# Patient Record
Sex: Male | Born: 1980 | Hispanic: No | Marital: Married | Smoking: Never smoker
Health system: Southern US, Community
[De-identification: ages and names within clinical notes are randomized; demographics above are authoritative.]

## PROBLEM LIST (undated history)

## (undated) DIAGNOSIS — N200 Calculus of kidney: Secondary | ICD-10-CM

---

## 2008-05-18 ENCOUNTER — Emergency Department (HOSPITAL_COMMUNITY): Admission: EM | Admit: 2008-05-18 | Discharge: 2008-05-18 | Payer: Self-pay | Admitting: Emergency Medicine

## 2017-10-24 ENCOUNTER — Other Ambulatory Visit: Payer: Self-pay

## 2017-10-24 ENCOUNTER — Emergency Department (HOSPITAL_BASED_OUTPATIENT_CLINIC_OR_DEPARTMENT_OTHER)
Admission: EM | Admit: 2017-10-24 | Discharge: 2017-10-24 | Disposition: A | Discharge status: Home / Self Care | Payer: BLUE CROSS/BLUE SHIELD | Attending: Emergency Medicine | Admitting: Emergency Medicine

## 2017-10-24 ENCOUNTER — Encounter (HOSPITAL_BASED_OUTPATIENT_CLINIC_OR_DEPARTMENT_OTHER): Payer: Self-pay | Admitting: Emergency Medicine

## 2017-10-24 ENCOUNTER — Emergency Department (HOSPITAL_BASED_OUTPATIENT_CLINIC_OR_DEPARTMENT_OTHER): Payer: BLUE CROSS/BLUE SHIELD

## 2017-10-24 DIAGNOSIS — N200 Calculus of kidney: Secondary | ICD-10-CM | POA: Insufficient documentation

## 2017-10-24 DIAGNOSIS — R112 Nausea with vomiting, unspecified: Secondary | ICD-10-CM | POA: Insufficient documentation

## 2017-10-24 DIAGNOSIS — R109 Unspecified abdominal pain: Secondary | ICD-10-CM | POA: Diagnosis present

## 2017-10-24 HISTORY — DX: Calculus of kidney: N20.0

## 2017-10-24 LAB — URINALYSIS, ROUTINE W REFLEX MICROSCOPIC
BILIRUBIN URINE: NEGATIVE
Glucose, UA: NEGATIVE mg/dL
KETONES UR: NEGATIVE mg/dL
Leukocytes, UA: NEGATIVE
NITRITE: NEGATIVE
PH: 6.5 (ref 5.0–8.0)
Protein, ur: NEGATIVE mg/dL
Specific Gravity, Urine: 1.02 (ref 1.005–1.030)

## 2017-10-24 LAB — URINALYSIS, MICROSCOPIC (REFLEX)

## 2017-10-24 LAB — BASIC METABOLIC PANEL
Anion gap: 7 (ref 5–15)
BUN: 11 mg/dL (ref 6–20)
CO2: 27 mmol/L (ref 22–32)
CREATININE: 0.93 mg/dL (ref 0.61–1.24)
Calcium: 9.2 mg/dL (ref 8.9–10.3)
Chloride: 100 mmol/L — ABNORMAL LOW (ref 101–111)
GFR calc Af Amer: >60 mL/min (ref >60)
GLUCOSE: 145 mg/dL — AB (ref 65–99)
POTASSIUM: 3.7 mmol/L (ref 3.5–5.1)
Sodium: 134 mmol/L — ABNORMAL LOW (ref 135–145)

## 2017-10-24 LAB — CBC
HCT: 43.2 % (ref 39.0–52.0)
Hemoglobin: 14.9 g/dL (ref 13.0–17.0)
MCH: 30 pg (ref 26.0–34.0)
MCHC: 34.5 g/dL (ref 30.0–36.0)
MCV: 87.1 fL (ref 78.0–100.0)
Platelets: 442 10*3/uL — ABNORMAL HIGH (ref 150–400)
RBC: 4.96 MIL/uL (ref 4.22–5.81)
RDW: 13.4 % (ref 11.5–15.5)
WBC: 15.1 10*3/uL — ABNORMAL HIGH (ref 4.0–10.5)

## 2017-10-24 MED ORDER — OXYCODONE-ACETAMINOPHEN 5-325 MG PO TABS: 1.0000 | ORAL_TABLET | ORAL | 0 refills | Status: AC | PRN

## 2017-10-24 MED ORDER — HYDROMORPHONE HCL 1 MG/ML IJ SOLN
1.0000 mg | Freq: Once | INTRAMUSCULAR | Status: AC
  Administered 2017-10-24: 1 mg via INTRAVENOUS
  Filled 2017-10-24: qty 1

## 2017-10-24 MED ORDER — TAMSULOSIN HCL 0.4 MG PO CAPS: 0.4000 mg | ORAL_CAPSULE | Freq: Every day | ORAL | 0 refills | Status: AC

## 2017-10-24 MED ORDER — ONDANSETRON HCL 4 MG/2ML IJ SOLN
4.0000 mg | Freq: Once | INTRAMUSCULAR | Status: AC
  Administered 2017-10-24: 4 mg via INTRAVENOUS
  Filled 2017-10-24: qty 2

## 2017-10-24 MED ORDER — MORPHINE SULFATE (PF) 4 MG/ML IV SOLN
4.0000 mg | Freq: Once | INTRAVENOUS | Status: AC
  Administered 2017-10-24: 4 mg via INTRAVENOUS
  Filled 2017-10-24: qty 1

## 2017-10-24 MED ORDER — PROMETHAZINE HCL 25 MG PO TABS: 25.0000 mg | ORAL_TABLET | Freq: Four times a day (QID) | ORAL | 0 refills | Status: AC | PRN

## 2017-10-24 MED ORDER — KETOROLAC TROMETHAMINE 10 MG PO TABS: 10.0000 mg | ORAL_TABLET | Freq: Four times a day (QID) | ORAL | 0 refills | Status: AC | PRN

## 2017-10-24 MED ORDER — SODIUM CHLORIDE 0.9 % IV BOLUS (SEPSIS)
1000.0000 mL | Freq: Once | INTRAVENOUS | Status: AC
  Administered 2017-10-24: 1000 mL via INTRAVENOUS

## 2017-10-24 MED ORDER — SODIUM CHLORIDE 0.9 % IV SOLN
INTRAVENOUS | Status: DC
  Administered 2017-10-24: 12:00:00 via INTRAVENOUS

## 2017-10-24 MED FILL — TAMSULOSIN HCL 0.4 MG CAPS: 0.4 | 14 days supply | Qty: 14 | Fill #0

## 2017-10-24 MED FILL — PROMETHAZINE 25 MG TABLET: 25 | 7 days supply | Qty: 30 | Fill #0

## 2017-10-24 MED FILL — KETOROLAC 10 MG TABLET: 10 | 2 days supply | Qty: 10 | Fill #0

## 2017-10-24 MED FILL — OXYCODONE-ACETAMINOPHEN 5-3: 5-325 | 2 days supply | Qty: 20 | Fill #0

## 2017-10-24 NOTE — ED Triage Notes (Signed)
Right flank pain since 10pm last night.  Sts he has had a kidney stone in the past and this is what it felt like.

## 2017-10-24 NOTE — ED Provider Notes (Signed)
[facility] EMERGENCY DEPARTMENT Provider Note   CSN: [id number] Arrival date & time: 10/24/17  1055     History   Chief Complaint Chief Complaint  Patient presents with  . Flank Pain    HPI Steven Rojas is a 37 y.o. male.  Pt presents to the ED today with right sided flank pain and n/v.  The pt said it started at 2200 last night and has continued.  The pt has had a kidney stone in the past and pain today feels the same.      Past Medical History:  Diagnosis Date  . Kidney stone     There are no active problems to display for this patient.   History reviewed. No pertinent surgical history.     Home Medications    Prior to Admission medications   Medication Sig Start Date End Date Taking? Authorizing Provider  ketorolac (TORADOL) 10 MG tablet Take 1 tablet (10 mg total) by mouth every 6 (six) hours as needed. 10/24/17   Jacalyn Lefevre, MD  oxyCODONE-acetaminophen (PERCOCET/ROXICET) 5-325 MG tablet Take 1-2 tablets by mouth every 4 (four) hours as needed for severe pain. 10/24/17   Jacalyn Lefevre, MD  promethazine (PHENERGAN) 25 MG tablet Take 1 tablet (25 mg total) by mouth every 6 (six) hours as needed for nausea or vomiting. 10/24/17   Jacalyn Lefevre, MD  tamsulosin (FLOMAX) 0.4 MG CAPS capsule Take 1 capsule (0.4 mg total) by mouth daily. 10/24/17   Jacalyn Lefevre, MD    Family History No family history on file.  Social History Social History   Tobacco Use  . Smoking status: Never Smoker  . Smokeless tobacco: Never Used  Substance Use Topics  . Alcohol use: No    Frequency: Never  . Drug use: No     Allergies   Patient has no known allergies.   Review of Systems Review of Systems  Gastrointestinal: Positive for nausea and vomiting.  Genitourinary: Positive for flank pain.  All other systems reviewed and are negative.    Physical Exam Updated Vital Signs BP (!) 148/93 (BP Location: Right Arm)   Pulse (!) 59   Temp 97.8 F  (36.6 C) (Oral)   Resp 16   Ht 6\' 1"  (1.854 m)   Wt 84.8 kg (187 lb)   SpO2 98%   BMI 24.67 kg/m   Physical Exam  Constitutional: He is oriented to person, place, and time. He appears well-developed and well-nourished.  HENT:  Head: Normocephalic and atraumatic.  Right Ear: External ear normal.  Left Ear: External ear normal.  Nose: Nose normal.  Mouth/Throat: Oropharynx is clear and moist.  Eyes: Conjunctivae and EOM are normal. Pupils are equal, round, and reactive to light.  Neck: Normal range of motion. Neck supple.  Cardiovascular: Normal rate, regular rhythm, normal heart sounds and intact distal pulses.  Pulmonary/Chest: Effort normal and breath sounds normal.  Abdominal: Soft. Bowel sounds are normal.  Musculoskeletal: Normal range of motion.  Neurological: He is alert and oriented to person, place, and time.  Skin: Skin is warm and dry. Capillary refill takes less than 2 seconds.  Psychiatric: He has a normal mood and affect. His behavior is normal. Judgment and thought content normal.  Nursing note and vitals reviewed.    ED Treatments / Results  Labs (all labs ordered are listed, but only abnormal results are displayed) Labs Reviewed  BASIC METABOLIC PANEL - Abnormal; Notable for the following components:      Result Value  Sodium 134 (*)    Chloride 100 (*)    Glucose, Bld 145 (*)    All other components within normal limits  CBC - Abnormal; Notable for the following components:   WBC 15.1 (*)    Platelets 442 (*)    All other components within normal limits  URINALYSIS, ROUTINE W REFLEX MICROSCOPIC - Abnormal; Notable for the following components:   Hgb urine dipstick MODERATE (*)    All other components within normal limits  URINALYSIS, MICROSCOPIC (REFLEX) - Abnormal; Notable for the following components:   Bacteria, UA FEW (*)    Squamous Epithelial / LPF 0-5 (*)    All other components within normal limits    EKG  EKG Interpretation None         Radiology Ct Renal Stone Study  Result Date: 10/24/2017 CLINICAL DATA:  RIGHT flank pain EXAM: CT ABDOMEN AND PELVIS WITHOUT CONTRAST TECHNIQUE: Multidetector CT imaging of the abdomen and pelvis was performed following the standard protocol without IV contrast. COMPARISON:  CT 05/18/2008 FINDINGS: Lower chest: Lung bases are clear. Hepatobiliary: No focal hepatic lesion. No biliary duct dilatation. Gallbladder is normal. Common bile duct is normal. Pancreas: Pancreas is normal. No ductal dilatation. No pancreatic inflammation. Spleen: Normal spleen Adrenals/urinary tract: Adrenal glands normal. There is mild hydronephrosis and hydroureter of the RIGHT collecting system secondary to obstructing calculus in the mid RIGHT ureter. This calculus measures 6 mm is at the L4 vertebral body level. Calculus is subtly evident on CT topogram. No additional renal calculi.  No bladder calculi. Stomach/Bowel: Stomach, small bowel, appendix, and cecum are normal. The colon and rectosigmoid colon are normal. Vascular/Lymphatic: Abdominal aorta is normal caliber. There is no retroperitoneal or periportal lymphadenopathy. No pelvic lymphadenopathy. Reproductive: Prostate normal Other: No free fluid. Musculoskeletal: No aggressive osseous lesion. IMPRESSION: 1. Obstructing calculus in mid RIGHT ureter. 2. No nephrolithiasis. Electronically Signed   By: Genevive Bi M.D.   On: 10/24/2017 11:34    Procedures Procedures (including critical care time)  Medications Ordered in ED Medications  sodium chloride 0.9 % bolus 1,000 mL (0 mLs Intravenous Stopped 10/24/17 1201)    And  0.9 %  sodium chloride infusion ( Intravenous Stopped 10/24/17 1250)  morphine 4 MG/ML injection 4 mg (4 mg Intravenous Given 10/24/17 1127)  ondansetron (ZOFRAN) injection 4 mg (4 mg Intravenous Given 10/24/17 1125)  sodium chloride 0.9 % bolus 1,000 mL (0 mLs Intravenous Stopped 10/24/17 1315)  HYDROmorphone (DILAUDID) injection 1 mg (1 mg  Intravenous Given 10/24/17 1244)  HYDROmorphone (DILAUDID) injection 1 mg (1 mg Intravenous Given 10/24/17 1337)     Initial Impression / Assessment and Plan / ED Course  I have reviewed the triage vital signs and the nursing notes.  Pertinent labs & imaging results that were available during my care of the patient were reviewed by me and considered in my medical decision making (see chart for details).    Pain is much improved.  He is offered admission as he does have an obstructed stone.  He said he will try outpatient treatment first.  He knows that he is welcome to come back at any time if pain becomes intolerable.  Pt told that [facility] ED is the best place to go if pain becomes worse as that is where urology admissions occur, but he knows he is also welcome [facility] at [facility].   Final Clinical Impressions(s) / ED Diagnoses   Final diagnoses:  Kidney stone    ED Discharge Orders  Ordered    oxyCODONE-acetaminophen (PERCOCET/ROXICET) 5-325 MG tablet  Every 4 hours PRN     10/24/17 1337    promethazine (PHENERGAN) 25 MG tablet  Every 6 hours PRN     10/24/17 1337    ketorolac (TORADOL) 10 MG tablet  Every 6 hours PRN     10/24/17 1337    tamsulosin (FLOMAX) 0.4 MG CAPS capsule  Daily     10/24/17 1337       Jacalyn LefevreHaviland, Almendra Loria, MD 10/24/17 1340

## 2017-10-24 NOTE — ED Notes (Signed)
ED Provider at bedside. 

## 2017-10-25 ENCOUNTER — Emergency Department (HOSPITAL_COMMUNITY)
Admission: EM | Admit: 2017-10-25 | Discharge: 2017-10-26 | Disposition: A | Discharge status: Home / Self Care | Payer: BLUE CROSS/BLUE SHIELD | Attending: Emergency Medicine | Admitting: Emergency Medicine

## 2017-10-25 ENCOUNTER — Other Ambulatory Visit: Payer: Self-pay

## 2017-10-25 DIAGNOSIS — R319 Hematuria, unspecified: Secondary | ICD-10-CM | POA: Diagnosis not present

## 2017-10-25 DIAGNOSIS — R109 Unspecified abdominal pain: Secondary | ICD-10-CM | POA: Insufficient documentation

## 2017-10-25 LAB — BASIC METABOLIC PANEL
Anion gap: 12 (ref 5–15)
BUN: 14 mg/dL (ref 6–20)
CHLORIDE: 103 mmol/L (ref 101–111)
CO2: 24 mmol/L (ref 22–32)
Calcium: 9.4 mg/dL (ref 8.9–10.3)
Creatinine, Ser: 1.18 mg/dL (ref 0.61–1.24)
GFR calc Af Amer: >60 mL/min (ref >60)
GFR calc non Af Amer: >60 mL/min (ref >60)
GLUCOSE: 122 mg/dL — AB (ref 65–99)
POTASSIUM: 3.8 mmol/L (ref 3.5–5.1)
SODIUM: 139 mmol/L (ref 135–145)

## 2017-10-25 LAB — CBC
HEMATOCRIT: 42.8 % (ref 39.0–52.0)
Hemoglobin: 14.3 g/dL (ref 13.0–17.0)
MCH: 30 pg (ref 26.0–34.0)
MCHC: 33.4 g/dL (ref 30.0–36.0)
MCV: 89.7 fL (ref 78.0–100.0)
Platelets: 438 10*3/uL — ABNORMAL HIGH (ref 150–400)
RBC: 4.77 MIL/uL (ref 4.22–5.81)
RDW: 13.3 % (ref 11.5–15.5)
WBC: 16 10*3/uL — AB (ref 4.0–10.5)

## 2017-10-25 LAB — URINALYSIS, ROUTINE W REFLEX MICROSCOPIC
Bacteria, UA: NONE SEEN
Bilirubin Urine: NEGATIVE
GLUCOSE, UA: NEGATIVE mg/dL
Ketones, ur: NEGATIVE mg/dL
Leukocytes, UA: NEGATIVE
NITRITE: NEGATIVE
PH: 5 (ref 5.0–8.0)
Protein, ur: NEGATIVE mg/dL
SPECIFIC GRAVITY, URINE: 1.018 (ref 1.005–1.030)
Squamous Epithelial / LPF: NONE SEEN

## 2017-10-25 MED ORDER — SODIUM CHLORIDE 0.9 % IV BOLUS (SEPSIS)
500.0000 mL | Freq: Once | INTRAVENOUS | Status: AC
  Administered 2017-10-25: 500 mL via INTRAVENOUS

## 2017-10-25 MED ORDER — ONDANSETRON HCL 4 MG/2ML IJ SOLN
4.0000 mg | Freq: Once | INTRAMUSCULAR | Status: AC
  Administered 2017-10-25: 4 mg via INTRAVENOUS
  Filled 2017-10-25: qty 2

## 2017-10-25 MED ORDER — KETOROLAC TROMETHAMINE 30 MG/ML IJ SOLN
30.0000 mg | Freq: Once | INTRAMUSCULAR | Status: AC
  Administered 2017-10-25: 30 mg via INTRAVENOUS
  Filled 2017-10-25: qty 1

## 2017-10-25 MED ORDER — HYDROMORPHONE HCL 1 MG/ML IJ SOLN
1.0000 mg | Freq: Once | INTRAMUSCULAR | Status: AC
  Administered 2017-10-25: 1 mg via INTRAVENOUS
  Filled 2017-10-25: qty 1

## 2017-10-25 NOTE — ED Notes (Signed)
Pt states he took 2 percocet approx 2 hours ago without relief.

## 2017-10-25 NOTE — ED Notes (Signed)
Pt rolling side to side in the bed moaning and groaning. Pt is visibly uncomfortable. Pt reassured that the MD will be in to see him shortly.

## 2017-10-25 NOTE — ED Triage Notes (Signed)
Pt reports right sided flank pain ongoing since yesterday. He was seen in [facility] yesterday and diagnosed with a 6mm stone. Pt states that his pain is uncontrolled with the prescription pain medication he was prescribed. States that he is still able to urinate.

## 2017-10-25 NOTE — ED Provider Notes (Signed)
[facility] EMERGENCY DEPARTMENT Provider Note   CSN: [id number] Arrival date & time: 10/25/17  1841     History   Chief Complaint Chief Complaint  Patient presents with  . Flank Pain    HPI Steven Rojas is a 37 y.o. male.  The history is provided by the patient and medical records.  Flank Pain     37 year old male with history of kidney stones, presenting to the ED with right flank pain.  He was seen in the ED yesterday at [facility] and had lab work as well as a CT study which did reveal a 6 mm obstructing right mid ureteral calculus.  States his pain was well controlled when he left but got worse today around 4pm.  States he has felt nauseated but denies vomiting.  Last oral intake around 2pm.  Has been in severe pain all afternoon.  Is still able to urinate without straining.  No fever/chills.  States he was told to follow-up with the urologist but felt so bad today he just came back here.  Past Medical History:  Diagnosis Date  . Kidney stone     There are no active problems to display for this patient.   No past surgical history on file.     Home Medications    Prior to Admission medications   Medication Sig Start Date End Date Taking? Authorizing Provider  ketorolac (TORADOL) 10 MG tablet Take 1 tablet (10 mg total) by mouth every 6 (six) hours as needed. 10/24/17   Jacalyn Lefevre, MD  oxyCODONE-acetaminophen (PERCOCET/ROXICET) 5-325 MG tablet Take 1-2 tablets by mouth every 4 (four) hours as needed for severe pain. 10/24/17   Jacalyn Lefevre, MD  promethazine (PHENERGAN) 25 MG tablet Take 1 tablet (25 mg total) by mouth every 6 (six) hours as needed for nausea or vomiting. 10/24/17   Jacalyn Lefevre, MD  tamsulosin (FLOMAX) 0.4 MG CAPS capsule Take 1 capsule (0.4 mg total) by mouth daily. 10/24/17   Jacalyn Lefevre, MD    Family History No family history on file.  Social History Social History   Tobacco Use  . Smoking status:  Never Smoker  . Smokeless tobacco: Never Used  Substance Use Topics  . Alcohol use: No    Frequency: Never  . Drug use: No     Allergies   Patient has no known allergies.   Review of Systems Review of Systems  Gastrointestinal: Positive for nausea.  Genitourinary: Positive for flank pain.  All other systems reviewed and are negative.    Physical Exam Updated Vital Signs BP (!) 160/94   Pulse (!) 57   Temp 97.8 F (36.6 C) (Oral)   Resp 20   Ht 6\' 1"  (1.854 m)   Wt 84.8 kg (187 lb)   SpO2 98%   BMI 24.67 kg/m   Physical Exam  Constitutional: He is oriented to person, place, and time. He appears well-developed and well-nourished.  Appears uncomfortable, writhing in bed  HENT:  Head: Normocephalic and atraumatic.  Mouth/Throat: Oropharynx is clear and moist.  Eyes: Conjunctivae and EOM are normal. Pupils are equal, round, and reactive to light.  Neck: Normal range of motion.  Cardiovascular: Normal rate, regular rhythm and normal heart sounds.  Pulmonary/Chest: Effort normal and breath sounds normal. No stridor. No respiratory distress.  Abdominal: Soft. Bowel sounds are normal. There is no tenderness. There is CVA tenderness (right). There is no rebound.  Musculoskeletal: Normal range of motion.  Neurological: He  is alert and oriented to person, place, and time.  Skin: Skin is warm and dry.  Psychiatric: He has a normal mood and affect.  Nursing note and vitals reviewed.    ED Treatments / Results  Labs (all labs ordered are listed, but only abnormal results are displayed) Labs Reviewed  URINALYSIS, ROUTINE W REFLEX MICROSCOPIC - Abnormal; Notable for the following components:      Result Value   Hgb urine dipstick LARGE (*)    All other components within normal limits  BASIC METABOLIC PANEL - Abnormal; Notable for the following components:   Glucose, Bld 122 (*)    All other components within normal limits  CBC - Abnormal; Notable for the following  components:   WBC 16.0 (*)    Platelets 438 (*)    All other components within normal limits    EKG  EKG Interpretation None       Radiology Ct Renal Stone Study  Result Date: 10/24/2017 CLINICAL DATA:  RIGHT flank pain EXAM: CT ABDOMEN AND PELVIS WITHOUT CONTRAST TECHNIQUE: Multidetector CT imaging of the abdomen and pelvis was performed following the standard protocol without IV contrast. COMPARISON:  CT 05/18/2008 FINDINGS: Lower chest: Lung bases are clear. Hepatobiliary: No focal hepatic lesion. No biliary duct dilatation. Gallbladder is normal. Common bile duct is normal. Pancreas: Pancreas is normal. No ductal dilatation. No pancreatic inflammation. Spleen: Normal spleen Adrenals/urinary tract: Adrenal glands normal. There is mild hydronephrosis and hydroureter of the RIGHT collecting system secondary to obstructing calculus in the mid RIGHT ureter. This calculus measures 6 mm is at the L4 vertebral body level. Calculus is subtly evident on CT topogram. No additional renal calculi.  No bladder calculi. Stomach/Bowel: Stomach, small bowel, appendix, and cecum are normal. The colon and rectosigmoid colon are normal. Vascular/Lymphatic: Abdominal aorta is normal caliber. There is no retroperitoneal or periportal lymphadenopathy. No pelvic lymphadenopathy. Reproductive: Prostate normal Other: No free fluid. Musculoskeletal: No aggressive osseous lesion. IMPRESSION: 1. Obstructing calculus in mid RIGHT ureter. 2. No nephrolithiasis. Electronically Signed   By: Genevive Bi M.D.   On: 10/24/2017 11:34    Procedures Procedures (including critical care time)  Medications Ordered in ED Medications  HYDROmorphone (DILAUDID) injection 1 mg (not administered)  ondansetron (ZOFRAN) injection 4 mg (4 mg Intravenous Given 10/25/17 2226)  HYDROmorphone (DILAUDID) injection 1 mg (1 mg Intravenous Given 10/25/17 2226)  ketorolac (TORADOL) 30 MG/ML injection 30 mg (30 mg Intravenous Given 10/25/17  2228)  sodium chloride 0.9 % bolus 500 mL (0 mLs Intravenous Stopped 10/25/17 2317)     Initial Impression / Assessment and Plan / ED Course  I have reviewed the triage vital signs and the nursing notes.  Pertinent labs & imaging results that were available during my care of the patient were reviewed by me and considered in my medical decision making (see chart for details).  37 year old male here with right flank pain.  He was seen in the ED yesterday and diagnosed with 6 mm right ureteral stone.  He was offered admission but elected to go home.  States he was doing okay until about 4 PM this afternoon, did take his pain medication at that time but without relief.  On exam he is afebrile and nontoxic but does appear uncomfortable, writhing around in bed.  Remains tender along right CVA.  No peritoneal signs.  Screening labs are reassuring.  UA without signs of infection.  Will plan for pain control.  11:47 PM Pain controlled at this time,  appears much more comfortable.  I did discuss with him that his stone is on the larger size and may have some issues passing on its own.  I offered to speak with urology about this for him, states he will call in the morning to schedule appt.  Patient was given second dose of meds prior to discharge.  I have gone over his OP medications with his as he was not taking them at correct intervals as there was some confusion what each medication was for-- I have written further instructions on his bottles for him.  Discussed plan with patient, he acknowledged understanding and agreed with plan of care.  Return precautions given for new or worsening symptoms.  Final Clinical Impressions(s) / ED Diagnoses   Final diagnoses:  Flank pain  Hematuria, unspecified type    ED Discharge Orders    None       Garlon HatchetSanders, Christop Hippert M, PA-C 10/26/17 [id number]    Arby BarrettePfeiffer, Marcy, MD 10/30/17 1743

## 2017-10-26 NOTE — Discharge Instructions (Signed)
Like we talked about, try to keep some of your pain medication in your system so pain does not get out of control. Can dose every 4 hours if needed, can add motrin between doses as well. Make sure to drink plenty of water to keep the urine flowing. Follow-up with urology. Return here for any new/acute changes.

## 2018-12-17 IMAGING — CT CT RENAL STONE PROTOCOL
2 of 4 series · 16 of 46 positions shown, 18 images · non-contrast
Comparison: CT 05/18/2008

CLINICAL DATA: RIGHT flank pain

EXAM:
CT ABDOMEN AND PELVIS WITHOUT CONTRAST
TECHNIQUE: Multidetector CT imaging of the abdomen and pelvis was performed
following the standard protocol without IV contrast.

[Series 2: axial st · axial · 0.95mm/px · z∈[-538,-33]mm · 13 of 111 slices shown, 15 images]
[im 5/111  soft-tissue]
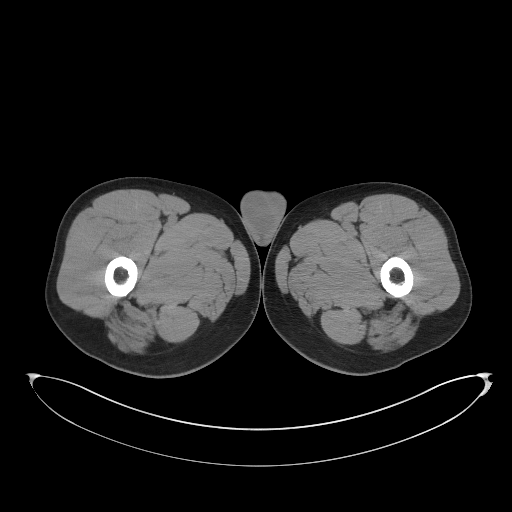
[im 5/111  bone]
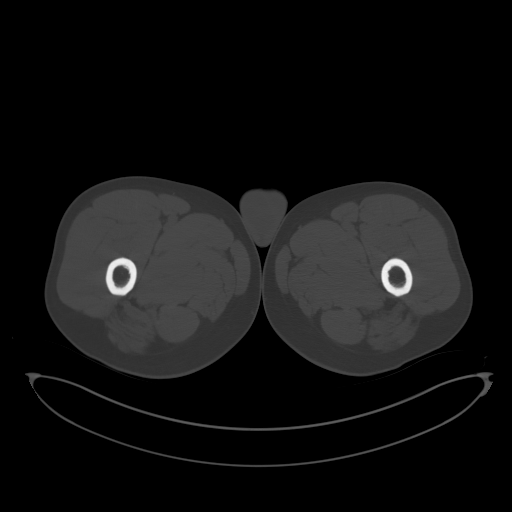
[im 14/111  soft-tissue]
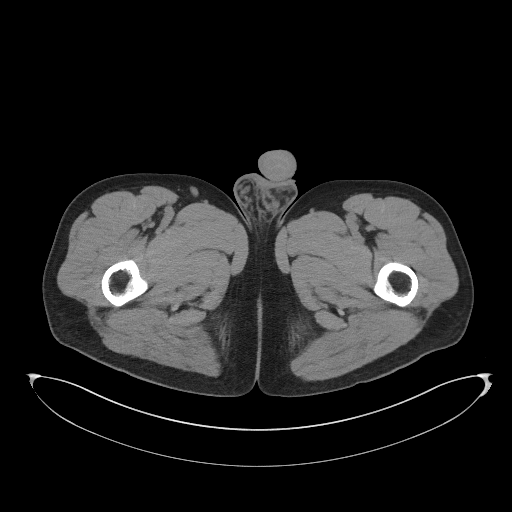
[im 23/111  soft-tissue]
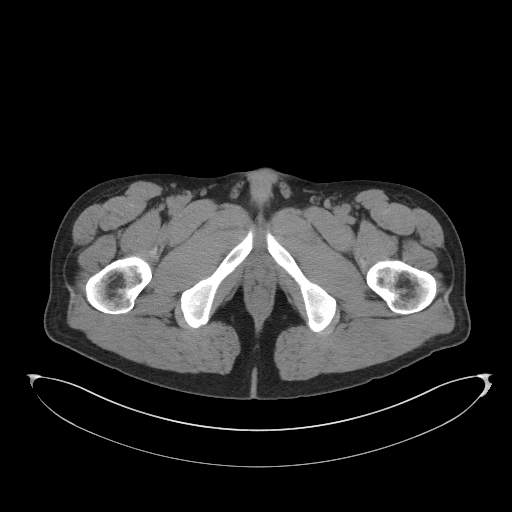
[im 31/111  soft-tissue]
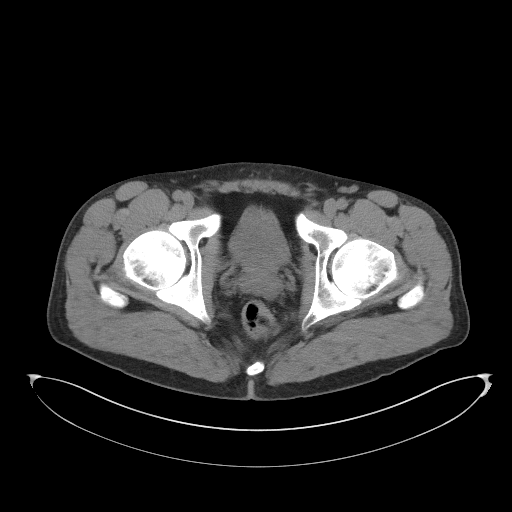
[im 40/111  soft-tissue]
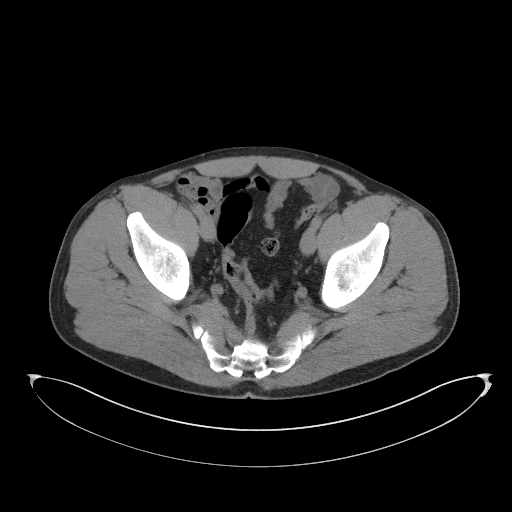
[im 49/111  soft-tissue]
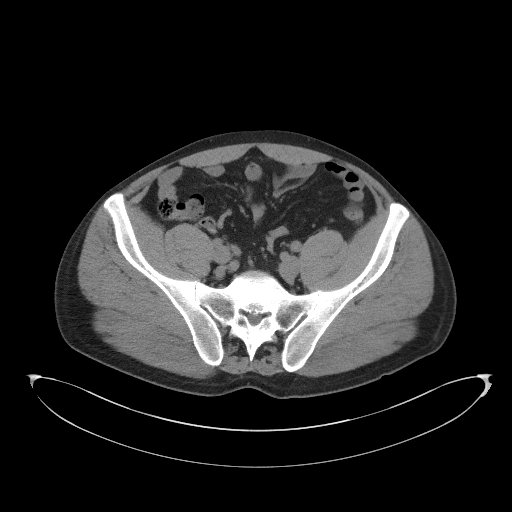
[im 58/111  soft-tissue]
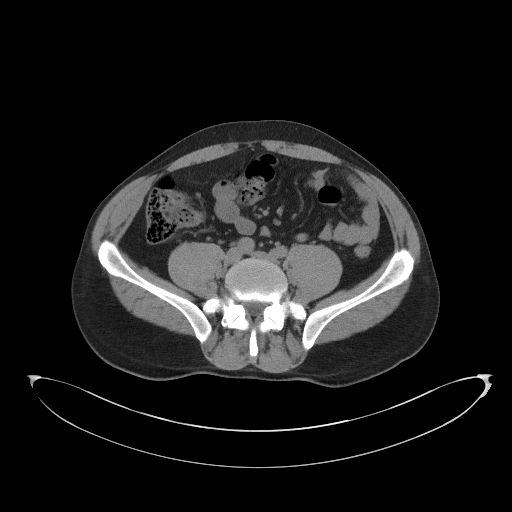
[im 62/111  soft-tissue]
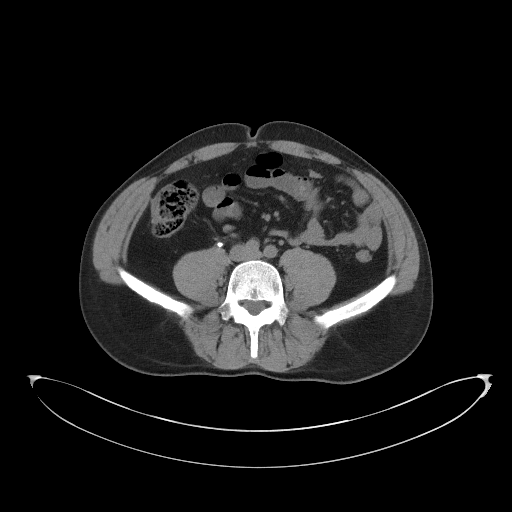
[im 71/111  soft-tissue]
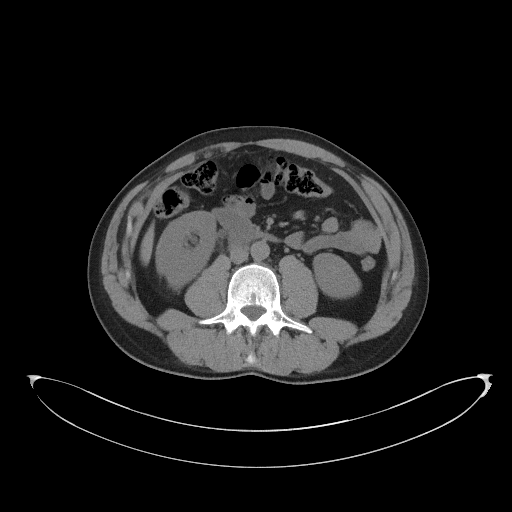
[im 71/111  bone]
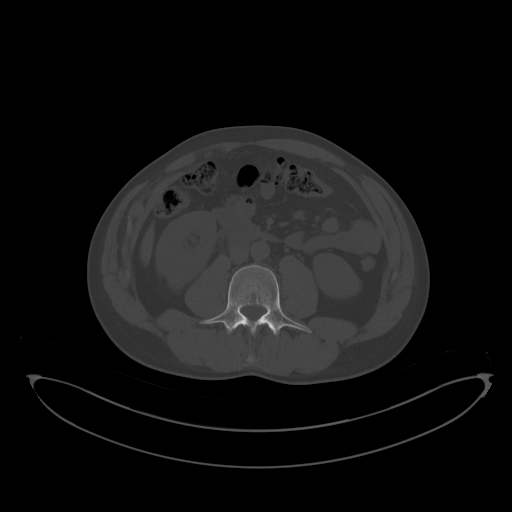
[im 80/111  soft-tissue]
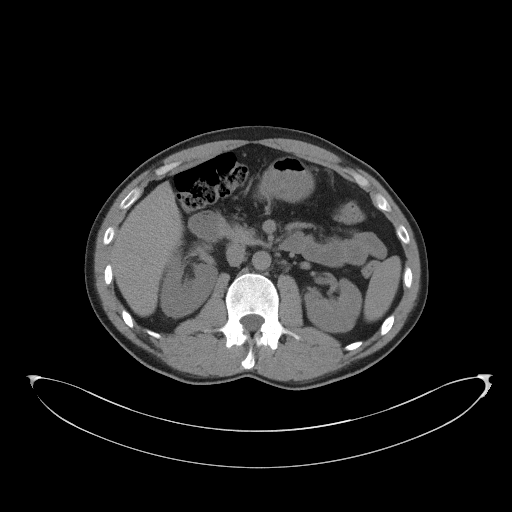
[im 89/111  soft-tissue]
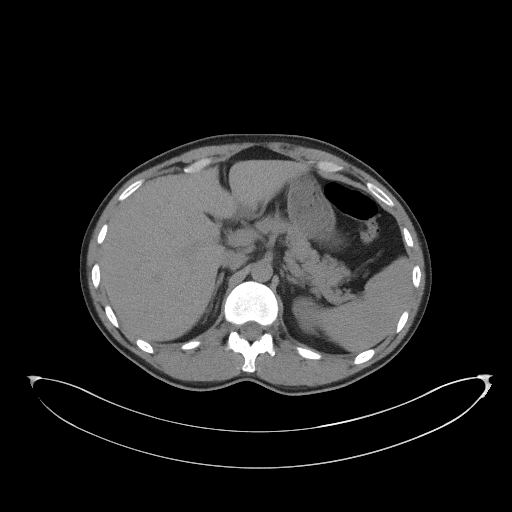
[im 97/111  soft-tissue]
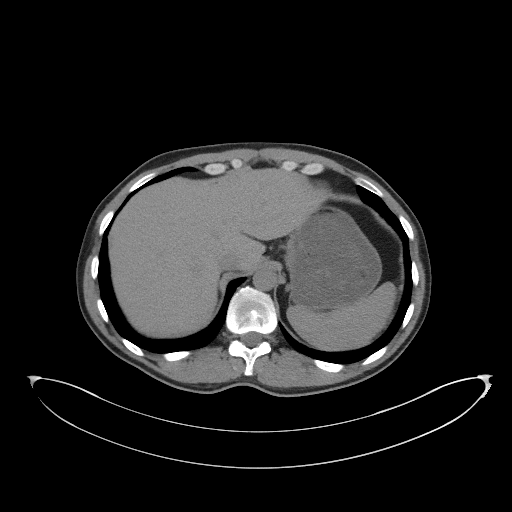
[im 106/111  soft-tissue]
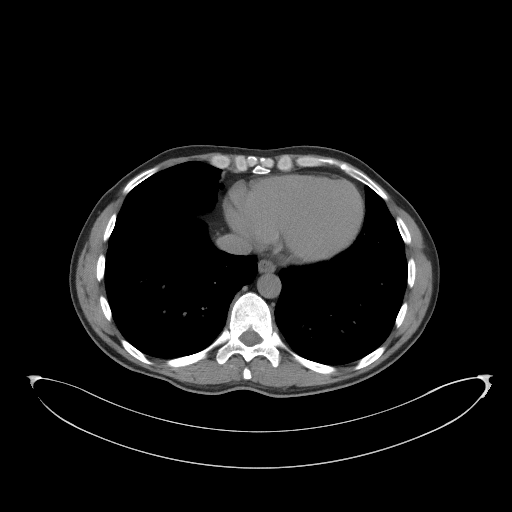

[Series 4: coronal st · coronal · 0.88mm/px · 3 of 94 slices shown]
[im 32/94  soft-tissue]
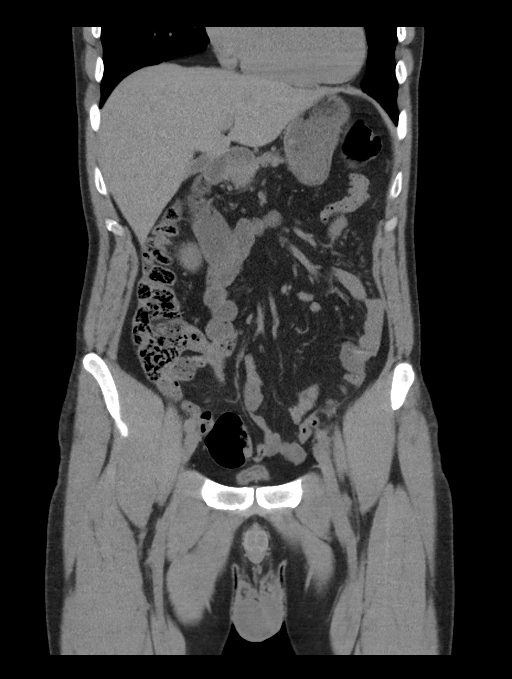
[im 42/94  soft-tissue]
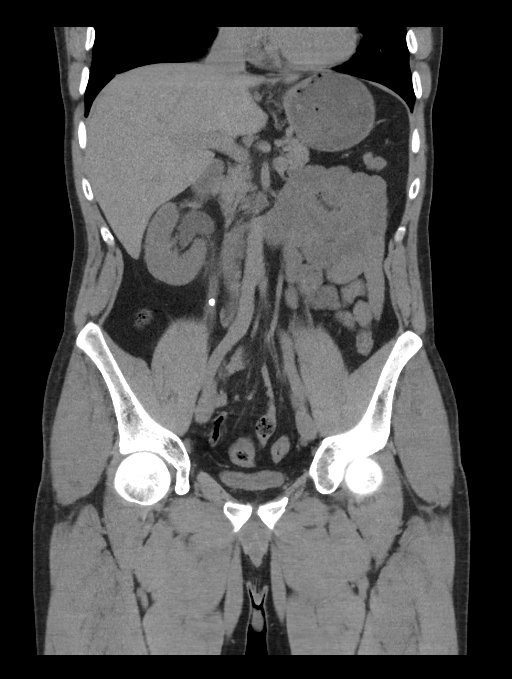
[im 52/94  soft-tissue]
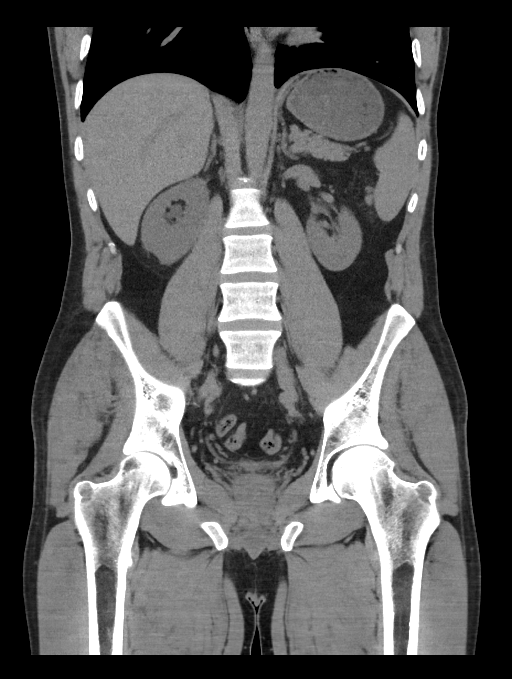

[16 of 46 positions shown; findings below may reference images not displayed]

FINDINGS: Lower chest: Lung bases are clear.

Hepatobiliary: No focal hepatic lesion. No biliary duct dilatation.
Gallbladder is normal. Common bile duct is normal.

Pancreas: Pancreas is normal. No ductal dilatation. No pancreatic
inflammation.

Spleen: Normal spleen

Adrenals/urinary tract: Adrenal glands normal.

There is mild hydronephrosis and hydroureter of the RIGHT collecting
system secondary to obstructing calculus in the mid RIGHT ureter.
This calculus measures 6 mm is at the L4 vertebral body level.
Calculus is subtly evident on CT topogram.

No additional renal calculi.  No bladder calculi.

Stomach/Bowel: Stomach, small bowel, appendix, and cecum are normal.
The colon and rectosigmoid colon are normal.

Vascular/Lymphatic: Abdominal aorta is normal caliber. There is no
retroperitoneal or periportal lymphadenopathy. No pelvic
lymphadenopathy.

Reproductive: Prostate normal

Other: No free fluid.

Musculoskeletal: No aggressive osseous lesion.
IMPRESSION: 1. Obstructing calculus in mid RIGHT ureter.
2. No nephrolithiasis.

## 2019-11-08 ENCOUNTER — Ambulatory Visit: Payer: Self-pay | Admitting: Adult Health
